# Patient Record
Sex: Female | Born: 1995 | Race: Black or African American | Hispanic: No | Marital: Single | Smoking: Never smoker
Health system: Southern US, Community
[De-identification: ages and names within clinical notes are randomized; demographics above are authoritative.]

---

## 2018-05-08 ENCOUNTER — Emergency Department (HOSPITAL_COMMUNITY): Payer: Self-pay

## 2018-05-08 ENCOUNTER — Emergency Department (HOSPITAL_COMMUNITY)
Admission: EM | Admit: 2018-05-08 | Discharge: 2018-05-09 | Disposition: A | Discharge status: Home / Self Care | Payer: Self-pay | Attending: Emergency Medicine | Admitting: Emergency Medicine

## 2018-05-08 ENCOUNTER — Encounter (HOSPITAL_COMMUNITY): Payer: Self-pay | Admitting: Emergency Medicine

## 2018-05-08 ENCOUNTER — Other Ambulatory Visit: Payer: Self-pay

## 2018-05-08 DIAGNOSIS — F1210 Cannabis abuse, uncomplicated: Secondary | ICD-10-CM | POA: Insufficient documentation

## 2018-05-08 DIAGNOSIS — Z036 Encounter for observation for suspected toxic effect from ingested substance ruled out: Secondary | ICD-10-CM | POA: Insufficient documentation

## 2018-05-08 DIAGNOSIS — R202 Paresthesia of skin: Secondary | ICD-10-CM | POA: Insufficient documentation

## 2018-05-08 DIAGNOSIS — R0602 Shortness of breath: Secondary | ICD-10-CM | POA: Insufficient documentation

## 2018-05-08 DIAGNOSIS — F410 Panic disorder [episodic paroxysmal anxiety] without agoraphobia: Secondary | ICD-10-CM | POA: Insufficient documentation

## 2018-05-08 LAB — BASIC METABOLIC PANEL
Anion gap: 12 (ref 5–15)
BUN: 5 mg/dL — ABNORMAL LOW (ref 6–20)
CO2: 21 mmol/L — AB (ref 22–32)
Calcium: 9.7 mg/dL (ref 8.9–10.3)
Chloride: 106 mmol/L (ref 98–111)
Creatinine, Ser: 0.83 mg/dL (ref 0.44–1.00)
GFR calc Af Amer: >60 mL/min (ref >60)
GFR calc non Af Amer: >60 mL/min (ref >60)
Glucose, Bld: 156 mg/dL — ABNORMAL HIGH (ref 70–99)
Potassium: 3.3 mmol/L — ABNORMAL LOW (ref 3.5–5.1)
Sodium: 139 mmol/L (ref 135–145)

## 2018-05-08 LAB — CBC
HCT: 31.6 % — ABNORMAL LOW (ref 36.0–46.0)
Hemoglobin: 9.9 g/dL — ABNORMAL LOW (ref 12.0–15.0)
MCH: 25.3 pg — ABNORMAL LOW (ref 26.0–34.0)
MCHC: 31.3 g/dL (ref 30.0–36.0)
MCV: 80.8 fL (ref 80.0–100.0)
Platelets: 485 10*3/uL — ABNORMAL HIGH (ref 150–400)
RBC: 3.91 MIL/uL (ref 3.87–5.11)
RDW: 16.4 % — ABNORMAL HIGH (ref 11.5–15.5)
WBC: 11.6 10*3/uL — ABNORMAL HIGH (ref 4.0–10.5)
nRBC: 0 % (ref 0.0–0.2)

## 2018-05-08 LAB — I-STAT TROPONIN, ED: Troponin i, poc: 0 ng/mL (ref 0.00–0.08)

## 2018-05-08 LAB — I-STAT BETA HCG BLOOD, ED (MC, WL, AP ONLY): I-stat hCG, quantitative: <5 m[IU]/mL (ref <5)

## 2018-05-08 MED ORDER — SODIUM CHLORIDE 0.9% FLUSH: 3.0000 mL | Freq: Once | INTRAVENOUS | Status: DC

## 2018-05-08 NOTE — ED Triage Notes (Signed)
Pt states she ate 1 edible marijuana gummy approx 3 hours ago.  C/o feeling "werid", chest pain, SOB, shaking, and dizziness x 2 hours.  States symptoms will stop for a few minutes and then return stronger than before.

## 2018-05-09 NOTE — Discharge Instructions (Addendum)
See your doctor if symptoms recur. Return to the emergency department with any new or concerning symptoms.

## 2018-05-09 NOTE — ED Provider Notes (Addendum)
[facility] EMERGENCY DEPARTMENT Provider Note   CSN: [id number] Arrival date & time: 05/08/18  2051     History   Chief Complaint Chief Complaint  Patient presents with  . Chest Pain  . Headache  . Shortness of Breath  . Ingestion    HPI Miranda Dillon is a 23 y.o. female.  Patient to ED with symptoms of palpitations described as heart racing, SOB, tingling of hands and chest wall and shaking. Symptoms lasted only minutes, resolved and recurred several times. She has been asymptomatic for the past several hours. She states that symptoms started about one hour after eating a marijuana gummy which was the first time she has tried this drug. No vomiting, pleuritic pain, fever, cough. No history of anxiety or panic.   The history is provided by the patient. No language interpreter was used.  Chest Pain  Associated symptoms: nausea, palpitations and shortness of breath   Associated symptoms: no abdominal pain, no fever and no vomiting   Headache  Associated symptoms: nausea   Associated symptoms: no abdominal pain, no fever and no vomiting   Shortness of Breath  Associated symptoms: chest pain   Associated symptoms: no abdominal pain, no fever and no vomiting   Ingestion  Associated symptoms include chest pain and shortness of breath. Pertinent negatives include no abdominal pain.    History reviewed. No pertinent past medical history.  There are no active problems to display for this patient.   History reviewed. No pertinent surgical history.   OB History   No obstetric history on file.      Home Medications    Prior to Admission medications   Medication Sig Start Date End Date Taking? Authorizing Provider  acetaminophen (TYLENOL) 325 MG tablet Take 650 mg by mouth every 6 (six) hours as needed for mild pain or fever.   Yes [provider]    Family History No family history on file.  Social History Social History   Tobacco  Use  . Smoking status: Never Smoker  . Smokeless tobacco: Never Used  Substance Use Topics  . Alcohol use: Not Currently  . Drug use: Yes    Types: Marijuana     Allergies   Patient has no known allergies.   Review of Systems Review of Systems  Constitutional: Negative for chills and fever.  HENT: Negative.   Eyes: Negative for visual disturbance.  Respiratory: Positive for shortness of breath.   Cardiovascular: Positive for chest pain and palpitations.  Gastrointestinal: Positive for nausea. Negative for abdominal pain and vomiting.  Musculoskeletal: Negative.   Skin: Negative.   Neurological:       Tingling sensation     Physical Exam Updated Vital Signs BP 131/89   Pulse 89   Temp 97.7 F (36.5 C) (Oral)   Resp 16   LMP 04/29/2018   SpO2 100%   Physical Exam Vitals signs and nursing note reviewed.  Constitutional:      Appearance: She is well-developed.  HENT:     Head: Normocephalic.  Neck:     Musculoskeletal: Normal range of motion and neck supple.  Cardiovascular:     Rate and Rhythm: Normal rate and regular rhythm.  Pulmonary:     Effort: Pulmonary effort is normal.     Breath sounds: Normal breath sounds. No wheezing, rhonchi or rales.  Abdominal:     General: Bowel sounds are normal.     Palpations: Abdomen is soft.  Tenderness: There is no abdominal tenderness. There is no guarding or rebound.  Musculoskeletal: Normal range of motion.  Skin:    General: Skin is warm and dry.     Findings: No rash.  Neurological:     Mental Status: She is alert.     Cranial Nerves: No cranial nerve deficit.      ED Treatments / Results  Labs (all labs ordered are listed, but only abnormal results are displayed) Labs Reviewed  BASIC METABOLIC PANEL - Abnormal; Notable for the following components:      Result Value   Potassium 3.3 (*)    CO2 21 (*)    Glucose, Bld 156 (*)    BUN 5 (*)    All other components within normal limits  CBC -  Abnormal; Notable for the following components:   WBC 11.6 (*)    Hemoglobin 9.9 (*)    HCT 31.6 (*)    MCH 25.3 (*)    RDW 16.4 (*)    Platelets 485 (*)    All other components within normal limits  I-STAT TROPONIN, ED  I-STAT BETA HCG BLOOD, ED ([facility], [facility], [facility] ONLY)    EKG None EKG: there are no previous tracings available for comparison, sinus tachycardia. Nonspecific T-wave changes  Radiology Dg Chest 2 View  Result Date: 05/08/2018 CLINICAL DATA:  Initial evaluation for acute shortness of breath, dizziness. EXAM: CHEST - 2 VIEW COMPARISON:  None. FINDINGS: The cardiac and mediastinal silhouettes are within normal limits. The lungs are normally inflated. No airspace consolidation, pleural effusion, or pulmonary edema is identified. There is no pneumothorax. No acute osseous abnormality identified. IMPRESSION: No active cardiopulmonary disease. Electronically Signed   By: Rise MuBenjamin  McClintock M.D.   On: 05/08/2018 22:23    Procedures Procedures (including critical care time)  Medications Ordered in ED Medications  sodium chloride flush (NS) 0.9 % injection 3 mL (has no administration in time range)     Initial Impression / Assessment and Plan / ED Course  I have reviewed the triage vital signs and the nursing notes.  Pertinent labs & imaging results that were available during my care of the patient were reviewed by me and considered in my medical decision making (see chart for details).     Patient to ED with symptoms palpitations, SOB, nausea, paresthesias onset about one hour after eating a marijuana gummy. Symptoms lasted minutes then resolved, recurred several times and has now resolved for several hours.   The patient was tachycardic on arrival. Currently she has a normal heart rate, appears comfortable, and has been asymptomatic for some time. No hypoxia. No pain with breathing at any time. No on hormones, not a smoker, no recent travel. Doubt PE. Symptoms are  characteristic with anxiety/panic.   She can be discharged home. Encouraged to see her doctor with any recurrent symptoms.   Final Clinical Impressions(s) / ED Diagnoses   Final diagnoses:  None   1. Panic attack  ED Discharge Orders    None       Elpidio AnisUpstill, Domanick Cuccia, PA-C 05/09/18 0223    Elpidio AnisUpstill, Tersea Aulds, PA-C 05/09/18 Jaclynn [id number]    Glynn Octaveancour, Stephen, MD 05/09/18 2145

## 2018-05-09 NOTE — ED Notes (Signed)
Patient Alert and oriented to baseline. Stable and ambulatory to baseline. Patient verbalized understanding of the discharge instructions.  Patient belongings were taken by the patient.   

## 2020-02-22 IMAGING — DX DG CHEST 2V
2 series · 2 of 2 positions shown · non-contrast
Comparison: None.

CLINICAL DATA: Initial evaluation for acute shortness of breath,
dizziness.

EXAM:
CHEST - 2 VIEW

[chest pa]
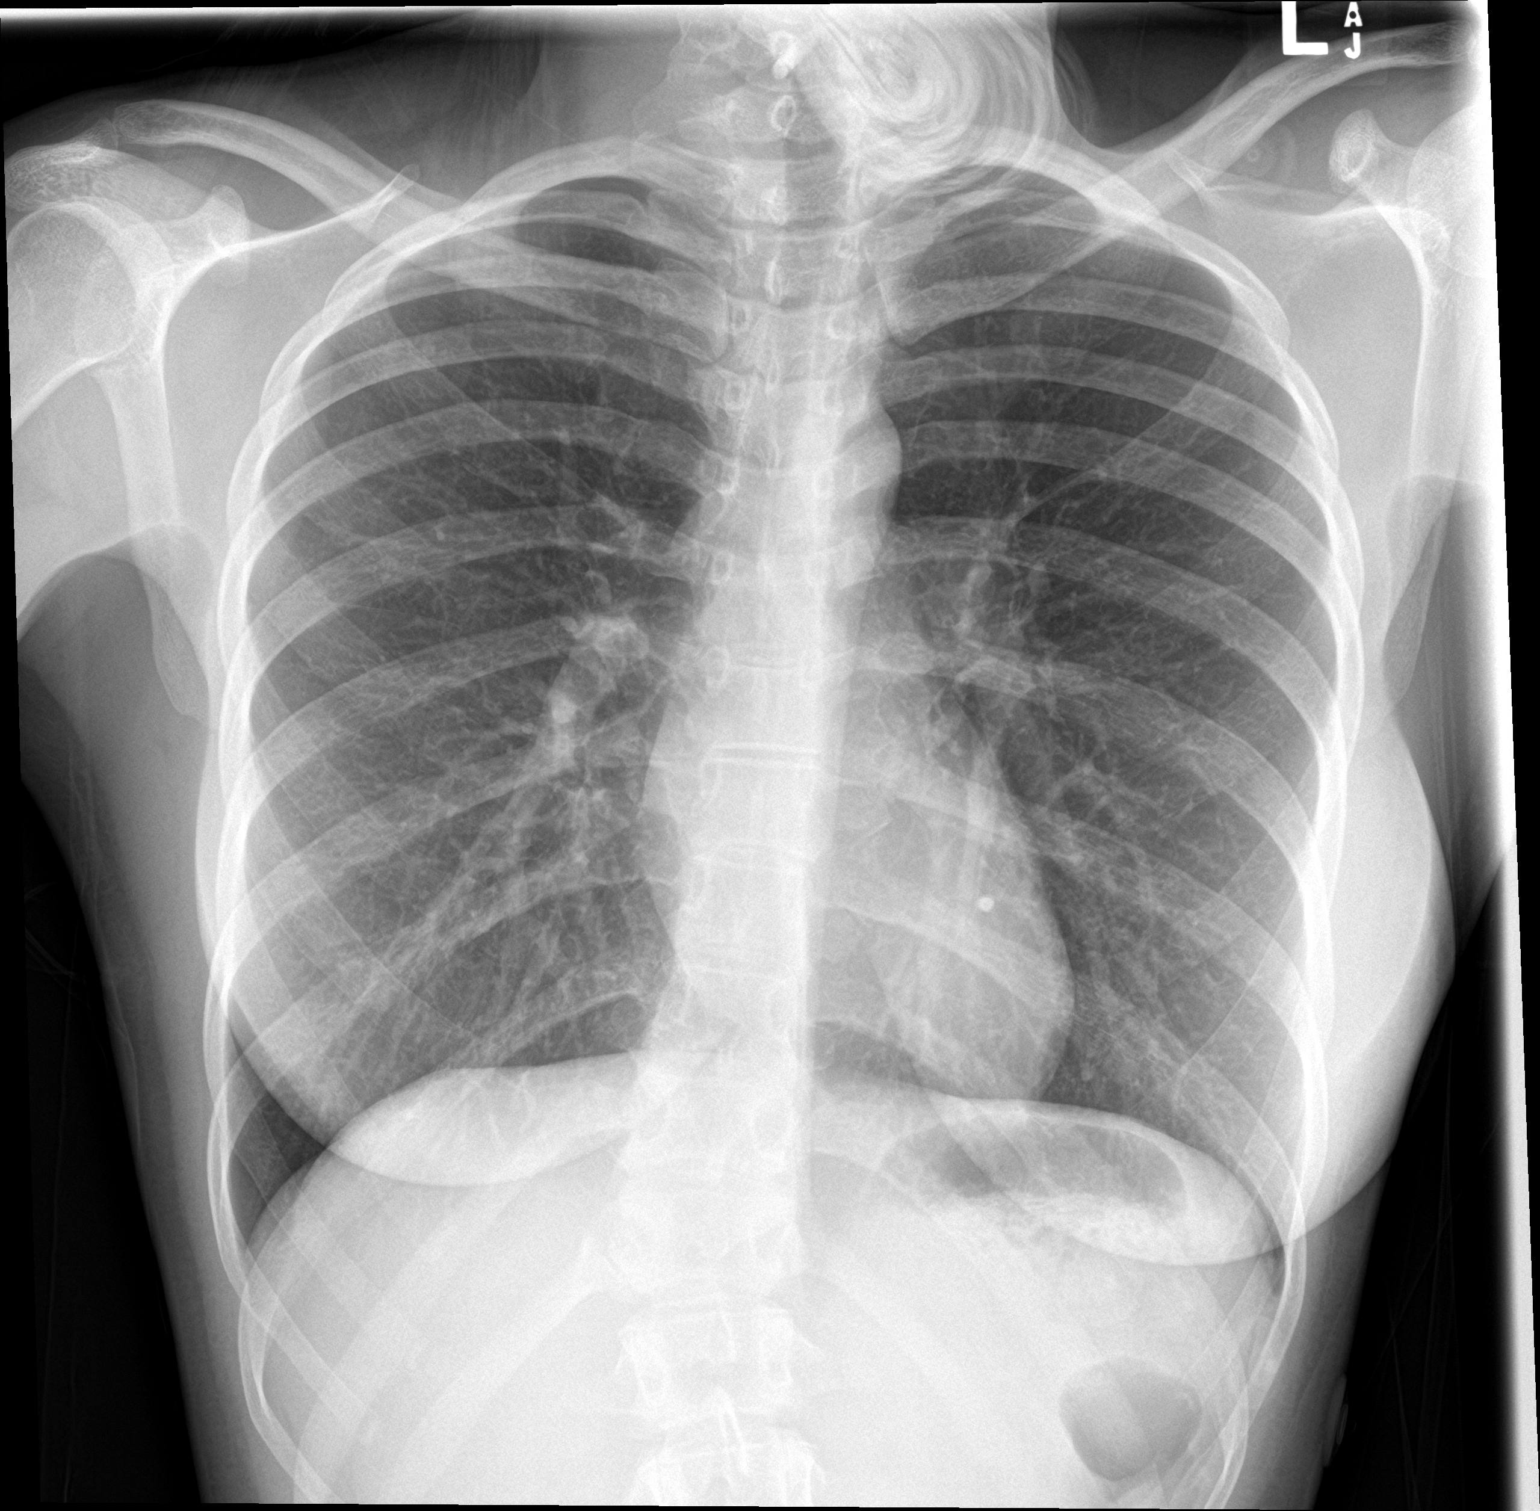

[chest lat]
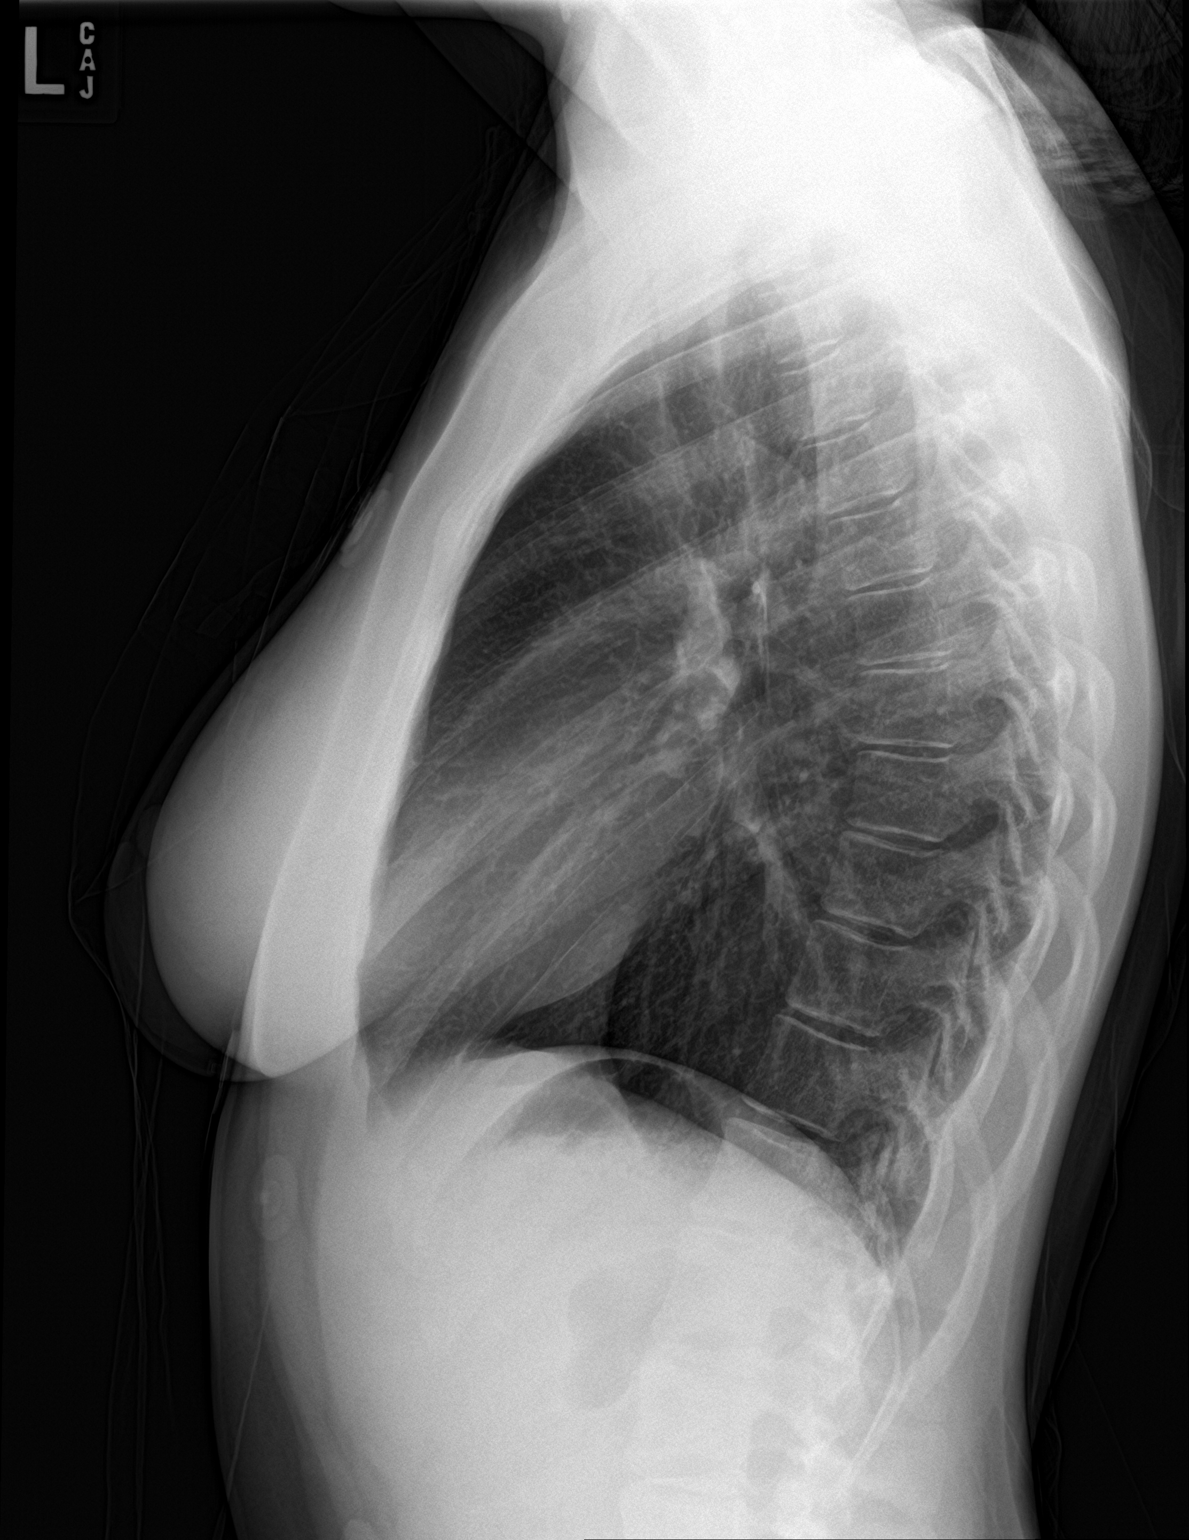

[2 of 2 positions shown; findings below may reference images not displayed]

FINDINGS: The cardiac and mediastinal silhouettes are within normal limits.

The lungs are normally inflated. No airspace consolidation, pleural
effusion, or pulmonary edema is identified. There is no
pneumothorax.

No acute osseous abnormality identified.
IMPRESSION: No active cardiopulmonary disease.
